# Patient Record
Sex: Male | Born: 1960 | Race: White | Hispanic: No | Marital: Married | Smoking: Current every day smoker
Health system: Southern US, Community
[De-identification: ages and names within clinical notes are randomized; demographics above are authoritative.]

---

## 2014-04-02 ENCOUNTER — Encounter (HOSPITAL_COMMUNITY): Payer: Self-pay | Admitting: *Deleted

## 2014-04-02 ENCOUNTER — Inpatient Hospital Stay (HOSPITAL_COMMUNITY)
Admission: EM | Admit: 2014-04-02 | Discharge: 2014-04-03 | DRG: 287 | Disposition: A | Discharge status: Home / Self Care | Payer: BC Managed Care – PPO | Attending: Internal Medicine | Admitting: Internal Medicine

## 2014-04-02 ENCOUNTER — Emergency Department (HOSPITAL_COMMUNITY): Payer: BC Managed Care – PPO

## 2014-04-02 DIAGNOSIS — E785 Hyperlipidemia, unspecified: Secondary | ICD-10-CM | POA: Diagnosis present

## 2014-04-02 DIAGNOSIS — I200 Unstable angina: Principal | ICD-10-CM | POA: Diagnosis present

## 2014-04-02 DIAGNOSIS — R079 Chest pain, unspecified: Secondary | ICD-10-CM | POA: Diagnosis present

## 2014-04-02 DIAGNOSIS — K219 Gastro-esophageal reflux disease without esophagitis: Secondary | ICD-10-CM | POA: Diagnosis present

## 2014-04-02 DIAGNOSIS — Z87891 Personal history of nicotine dependence: Secondary | ICD-10-CM | POA: Diagnosis not present

## 2014-04-02 DIAGNOSIS — Z8249 Family history of ischemic heart disease and other diseases of the circulatory system: Secondary | ICD-10-CM

## 2014-04-02 LAB — LIPID PANEL
CHOLESTEROL: 254 mg/dL — AB (ref 0–200)
HDL: 35 mg/dL — ABNORMAL LOW (ref >39)
LDL Cholesterol: 195 mg/dL — ABNORMAL HIGH (ref 0–99)
Total CHOL/HDL Ratio: 7.3 RATIO
Triglycerides: 122 mg/dL (ref <150)
VLDL: 24 mg/dL (ref 0–40)

## 2014-04-02 LAB — COMPREHENSIVE METABOLIC PANEL
ALBUMIN: 4.1 g/dL (ref 3.5–5.2)
ALT: 19 U/L (ref 0–53)
ANION GAP: 16 — AB (ref 5–15)
AST: 32 U/L (ref 0–37)
Alkaline Phosphatase: 57 U/L (ref 39–117)
BUN: 15 mg/dL (ref 6–23)
CO2: 21 mEq/L (ref 19–32)
CREATININE: 0.95 mg/dL (ref 0.50–1.35)
Calcium: 9.8 mg/dL (ref 8.4–10.5)
Chloride: 98 mEq/L (ref 96–112)
GFR calc non Af Amer: >90 mL/min (ref >90)
Glucose, Bld: 95 mg/dL (ref 70–99)
Potassium: 4.9 mEq/L (ref 3.7–5.3)
Sodium: 135 mEq/L — ABNORMAL LOW (ref 137–147)
TOTAL PROTEIN: 7.9 g/dL (ref 6.0–8.3)
Total Bilirubin: 1 mg/dL (ref 0.3–1.2)

## 2014-04-02 LAB — CBC WITH DIFFERENTIAL/PLATELET
BASOS PCT: 2 % — AB (ref 0–1)
Basophils Absolute: 0.1 10*3/uL (ref 0.0–0.1)
Eosinophils Absolute: 0.1 10*3/uL (ref 0.0–0.7)
Eosinophils Relative: 1 % (ref 0–5)
HEMATOCRIT: 41.9 % (ref 39.0–52.0)
HEMOGLOBIN: 14.9 g/dL (ref 13.0–17.0)
LYMPHS PCT: 34 % (ref 12–46)
Lymphs Abs: 2.3 10*3/uL (ref 0.7–4.0)
MCH: 31.3 pg (ref 26.0–34.0)
MCHC: 35.6 g/dL (ref 30.0–36.0)
MCV: 88 fL (ref 78.0–100.0)
MONO ABS: 0.5 10*3/uL (ref 0.1–1.0)
Monocytes Relative: 8 % (ref 3–12)
NEUTROS ABS: 3.6 10*3/uL (ref 1.7–7.7)
NEUTROS PCT: 55 % (ref 43–77)
Platelets: 193 10*3/uL (ref 150–400)
RBC: 4.76 MIL/uL (ref 4.22–5.81)
RDW: 13.4 % (ref 11.5–15.5)
WBC: 6.6 10*3/uL (ref 4.0–10.5)

## 2014-04-02 LAB — I-STAT TROPONIN, ED: TROPONIN I, POC: 0 ng/mL (ref 0.00–0.08)

## 2014-04-02 LAB — TROPONIN I

## 2014-04-02 LAB — D-DIMER, QUANTITATIVE: D-Dimer, Quant: 0.3 ug/mL-FEU (ref 0.00–0.48)

## 2014-04-02 MED ORDER — ENOXAPARIN SODIUM 40 MG/0.4ML ~~LOC~~ SOLN
40.0000 mg | SUBCUTANEOUS | Status: DC
  Administered 2014-04-03: 40 mg via SUBCUTANEOUS
  Filled 2014-04-02: qty 0.4

## 2014-04-02 MED ORDER — MORPHINE SULFATE 4 MG/ML IJ SOLN
4.0000 mg | Freq: Once | INTRAMUSCULAR | Status: AC
  Administered 2014-04-02: 4 mg via INTRAVENOUS
  Filled 2014-04-02: qty 1

## 2014-04-02 MED ORDER — HYDROMORPHONE HCL 1 MG/ML IJ SOLN
1.0000 mg | INTRAMUSCULAR | Status: DC | PRN
Start: 2014-04-02 — End: 2014-04-03
  Administered 2014-04-02: 1 mg via INTRAVENOUS
  Filled 2014-04-02: qty 1

## 2014-04-02 MED ORDER — FENTANYL CITRATE 0.05 MG/ML IJ SOLN
50.0000 ug | Freq: Once | INTRAMUSCULAR | Status: AC
  Administered 2014-04-02: 50 ug via INTRAVENOUS
  Filled 2014-04-02: qty 2

## 2014-04-02 MED ORDER — SODIUM CHLORIDE 0.9 % IJ SOLN
3.0000 mL | Freq: Two times a day (BID) | INTRAMUSCULAR | Status: DC
  Administered 2014-04-03 (×2): 3 mL via INTRAVENOUS

## 2014-04-02 MED ORDER — ACETAMINOPHEN 325 MG PO TABS: 650.0000 mg | ORAL_TABLET | Freq: Four times a day (QID) | ORAL | Status: DC | PRN

## 2014-04-02 MED ORDER — SODIUM CHLORIDE 0.9 % IJ SOLN: 3.0000 mL | INTRAMUSCULAR | Status: DC | PRN

## 2014-04-02 MED ORDER — MORPHINE SULFATE 2 MG/ML IJ SOLN: 2.0000 mg | INTRAMUSCULAR | Status: DC | PRN

## 2014-04-02 MED ORDER — SODIUM CHLORIDE 0.9 % IV SOLN: 250.0000 mL | INTRAVENOUS | Status: DC | PRN

## 2014-04-02 MED ORDER — ASPIRIN 81 MG PO CHEW
324.0000 mg | CHEWABLE_TABLET | Freq: Once | ORAL | Status: AC
  Administered 2014-04-02: 324 mg via ORAL
  Filled 2014-04-02: qty 4

## 2014-04-02 MED ORDER — ACETAMINOPHEN 650 MG RE SUPP: 650.0000 mg | Freq: Four times a day (QID) | RECTAL | Status: DC | PRN

## 2014-04-02 MED ORDER — ALUM & MAG HYDROXIDE-SIMETH 200-200-20 MG/5ML PO SUSP: 30.0000 mL | Freq: Four times a day (QID) | ORAL | Status: DC | PRN

## 2014-04-02 MED ORDER — NITROGLYCERIN 0.4 MG SL SUBL
0.4000 mg | SUBLINGUAL_TABLET | SUBLINGUAL | Status: AC | PRN
  Administered 2014-04-02 (×3): 0.4 mg via SUBLINGUAL
  Filled 2014-04-02: qty 1

## 2014-04-02 MED ORDER — HYDROMORPHONE HCL 1 MG/ML IJ SOLN
2.0000 mg | Freq: Once | INTRAMUSCULAR | Status: AC
Start: 2014-04-02 — End: 2014-04-02
  Administered 2014-04-02: 2 mg via INTRAVENOUS
  Filled 2014-04-02: qty 2

## 2014-04-02 MED ORDER — PANTOPRAZOLE SODIUM 40 MG IV SOLR
40.0000 mg | INTRAVENOUS | Status: DC
  Administered 2014-04-03: 40 mg via INTRAVENOUS
  Filled 2014-04-02 (×2): qty 40

## 2014-04-02 NOTE — ED Notes (Signed)
Report to [place], rn.  Pt care transferred

## 2014-04-02 NOTE — ED Notes (Signed)
Pt in c/o central chest pain with radiation down his right arm with numbness and tingling, states this started a few hours ago and has been constant, no distress noted

## 2014-04-02 NOTE — ED Notes (Signed)
Pt given heart healthy diet from dietary services, ate 90% of meal, now resting comfortably

## 2014-04-02 NOTE — H&P (Signed)
PCP:   No primary care provider on file.   Chief Complaint:  Chest pain  HPI: 53 year old male who   has no past medical history on file.  Today presents to the hospital with chief computer of chest pain which started this afternoon. Patient says that the pain started in the center of the chest and then also involve the right side and radiated to the right arm. The pain was sharp 9/10 in intensity and has come down to 6/10 intensity. Patient says that he ate  [place] food last night, and this morning he felt uneasy in the stomach so he took 2 Rolaids. He says that his symptoms improved little bit, but he continued to have the chest pain. He does not have history of CAD, he is a former smoker quit smoking 3 years ago, no history of diabetes mellitus or hypertension. Patient's father had cancer and is not sure whether he died of MI. In the ED patient was found to have normal EKG, normal chest x-ray, cardiac enzymes are negative 1.  Allergies:  No Known Allergies   History reviewed. No pertinent past medical history.  History reviewed. No pertinent past surgical history.  Prior to Admission medications   Not on File    Social History:  reports that he has been smoking.  He does not have any smokeless tobacco history on file. His alcohol and drug histories are not on file.  History reviewed. No pertinent family history.   All the positives are listed in BOLD  Review of Systems:  HEENT: Headache, blurred vision, runny nose, sore throat Neck: Hypothyroidism, hyperthyroidism,,lymphadenopathy Chest : Shortness of breath, history of COPD, Asthma Heart : Chest pain, history of coronary arterey disease GI:  Nausea, vomiting, diarrhea, constipation, GERD GU: Dysuria, urgency, frequency of urination, hematuria Neuro: Stroke, seizures, syncope Psych: Depression, anxiety, hallucinations   Physical Exam: Blood pressure 107/70, pulse 73, temperature 97.5 F (36.4 C), temperature source  Oral, resp. rate 19, height 5\' 4"  (1.626 m), weight 90.719 kg (200 lb), SpO2 96 %. Constitutional:   Patient is a well-developed and well-nourished male in no acute distress and cooperative with exam. Head: Normocephalic and atraumatic Mouth: Mucus membranes moist Eyes: PERRL, EOMI, conjunctivae normal Neck: Supple, No Thyromegaly Cardiovascular: RRR, S1 normal, S2 normal Pulmonary/Chest: CTAB, no wheezes, rales, or rhonchi. Positive tenderness to palpation of chest wall and the sternum Abdominal: Soft. Non-tender, non-distended, bowel sounds are normal, no masses, organomegaly, or guarding present.  Neurological: A&O x3, Strenght is normal and symmetric bilaterally, cranial nerve II-XII are grossly intact, no focal motor deficit, sensory intact to light touch bilaterally.  Extremities : No Cyanosis, Clubbing or Edema  Labs on Admission:  Basic Metabolic Panel:  Recent Labs Lab 04/02/14 1250  NA 135*  K 4.9  CL 98  CO2 21  GLUCOSE 95  BUN 15  CREATININE 0.95  CALCIUM 9.8   Liver Function Tests:  Recent Labs Lab 04/02/14 1250  AST 32  ALT 19  ALKPHOS 57  BILITOT 1.0  PROT 7.9  ALBUMIN 4.1   No results for input(s): LIPASE, AMYLASE in the last 168 hours. No results for input(s): AMMONIA in the last 168 hours. CBC:  Recent Labs Lab 04/02/14 1341  WBC 6.6  NEUTROABS 3.6  HGB 14.9  HCT 41.9  MCV 88.0  PLT 193   Cardiac Enzymes: No results for input(s): CKTOTAL, CKMB, CKMBINDEX, TROPONINI in the last 168 hours.  BNP (last 3 results) No results for input(s): PROBNP  in the last 8760 hours. CBG: No results for input(s): GLUCAP in the last 168 hours.  Radiological Exams on Admission: Dg Chest 2 View  04/02/2014   CLINICAL DATA:  Acute onset mid and right-sided chest pain radiating into the right arm earlier today. Former smoker who quit approximately 3 years ago.  EXAM: CHEST  2 VIEW  COMPARISON:  None.  FINDINGS: Cardiac silhouette normal in size. Thoracic aorta  mildly tortuous and atherosclerotic. Hilar and mediastinal contours otherwise unremarkable. Lungs clear. Bronchovascular markings normal. Pulmonary vascularity normal. No visible pleural effusions. No pneumothorax. Degenerative changes involving the thoracic spine.  IMPRESSION: No acute cardiopulmonary disease.   Electronically Signed   By: Hulan Saashomas  Lawrence M.D.   On: 04/02/2014 13:45    EKG: Independently reviewed. Normal sinus rhythm   Assessment/Plan Principal Problem:   Chest pain  Chest pain Appears atypical, will admit the patient and cycle the cardiac enzymes. Will give morphine when necessary for pain. We'll also start IV Protonix 40 mg daily along with Maalox 30 mL every 6 hours when necessary for dyspepsia. I will also obtain lipid panel in a.m.  Dyspnea on exertion Patient says that he has been having dyspnea on exertion for a long time, will check echocardiogram to assess the ejection fraction and any other wall motion abnormality.  DVT prophylaxis Lovenox  Code status:Patient is full code  Family discussion: Admission, patients condition and plan of care including tests being ordered have been discussed with the patient and *his wife at bedside* who indicate understanding and agree with the plan and Code Status.   Time Spent on Admission: 60 minutes  Aydn Ferrara S [facility] Pager: [phone] 04/02/2014, 4:30 PM  If 7PM-7AM, please contact night-coverage  [email or web address]  Password [email or web address]

## 2014-04-02 NOTE — ED Provider Notes (Signed)
CSN: [id number]     Arrival date & time 04/02/14  1223 History   First MD Initiated Contact with Patient 04/02/14 1231     Chief Complaint  Patient presents with  . Chest Pain    Patient is a 53 y.o. male presenting with chest pain. The history is provided by the patient.  Chest Pain Pain location:  R chest and substernal area Pain quality: sharp   Pain radiates to:  R arm Pain radiates to the back: no   Pain severity:  Severe Onset quality:  Sudden Duration:  2 hours Timing:  Constant Progression:  Unchanged Chronicity:  New Context: at rest   Relieved by:  None tried Worsened by:  Nothing tried Ineffective treatments:  None tried Associated symptoms: diaphoresis and shortness of breath   Associated symptoms: no abdominal pain, no cough, no fever, no nausea and not vomiting   Shortness of breath:    Severity:  Moderate   Onset quality:  Sudden   Duration:  2 hours   Timing:  Constant   Progression:  Waxing and waning Risk factors: no coronary artery disease, no diabetes mellitus, no high cholesterol and no hypertension     History reviewed. No pertinent past medical history.   History reviewed. No pertinent past surgical history.   History reviewed. No pertinent family history.   History  Substance Use Topics  . Smoking status: Current Every Day Smoker  . Smokeless tobacco: Not on file  . Alcohol Use: Not on file    Review of Systems  Constitutional: Positive for diaphoresis. Negative for fever.  Respiratory: Positive for shortness of breath. Negative for cough.   Cardiovascular: Positive for chest pain.  Gastrointestinal: Negative for nausea, vomiting and abdominal pain.  All other systems reviewed and are negative.   Allergies  Review of patient's allergies indicates no known allergies.  Home Medications   Prior to Admission medications   Not on File   BP 127/76 mmHg  Pulse 87  Temp(Src) 97.5 F (36.4 C) (Oral)  Resp 20  Ht 5\' 4"  (1.626 m)  Wt  200 lb (90.719 kg)  BMI 34.31 kg/m2  SpO2 99%   Physical Exam  Constitutional: He is oriented to person, place, and time. He appears well-developed and well-nourished. No distress.  HENT:  Head: Normocephalic and atraumatic.  Right Ear: External ear normal.  Left Ear: External ear normal.  Mouth/Throat: Oropharynx is clear and moist.  Eyes: EOM are normal. Pupils are equal, round, and reactive to light.  Neck: Normal range of motion.  Cardiovascular: Normal rate and regular rhythm.   Pulses:      Radial pulses are 2+ on the right side, and 2+ on the left side.       Dorsalis pedis pulses are 2+ on the right side, and 2+ on the left side.  Pulmonary/Chest: Effort normal and breath sounds normal. No respiratory distress. He has no wheezes. He has no rales.  Abdominal: Soft. He exhibits no distension. There is no tenderness. There is no rebound and no guarding.  Neurological: He is alert and oriented to person, place, and time.  Skin: Skin is warm and dry. No rash noted. He is not diaphoretic.  Psychiatric: He has a normal mood and affect.  Vitals reviewed.   ED Course  Procedures  Labs Review  Results for orders placed or performed during the hospital encounter of 04/02/14  Comprehensive metabolic panel  Result Value Ref Range   Sodium 135 (L) 137 -  147 mEq/L   Potassium 4.9 3.7 - 5.3 mEq/L   Chloride 98 96 - 112 mEq/L   CO2 21 19 - 32 mEq/L   Glucose, Bld 95 70 - 99 mg/dL   BUN 15 6 - 23 mg/dL   Creatinine, Ser 1.190.95 0.50 - 1.35 mg/dL   Calcium 9.8 8.4 - 14.710.5 mg/dL   Total Protein 7.9 6.0 - 8.3 g/dL   Albumin 4.1 3.5 - 5.2 g/dL   AST 32 0 - 37 U/L   ALT 19 0 - 53 U/L   Alkaline Phosphatase 57 39 - 117 U/L   Total Bilirubin 1.0 0.3 - 1.2 mg/dL   GFR calc non Af Amer >90 >90 mL/min   GFR calc Af Amer >90 >90 mL/min   Anion gap 16 (H) 5 - 15  CBC with Differential  Result Value Ref Range   WBC 6.6 4.0 - 10.5 K/uL   RBC 4.76 4.22 - 5.81 MIL/uL   Hemoglobin 14.9 13.0 -  17.0 g/dL   HCT 82.941.9 56.239.0 - 13.052.0 %   MCV 88.0 78.0 - 100.0 fL   MCH 31.3 26.0 - 34.0 pg   MCHC 35.6 30.0 - 36.0 g/dL   RDW 86.513.4 78.411.5 - 69.615.5 %   Platelets 193 150 - 400 K/uL   Neutrophils Relative % 55 43 - 77 %   Neutro Abs 3.6 1.7 - 7.7 K/uL   Lymphocytes Relative 34 12 - 46 %   Lymphs Abs 2.3 0.7 - 4.0 K/uL   Monocytes Relative 8 3 - 12 %   Monocytes Absolute 0.5 0.1 - 1.0 K/uL   Eosinophils Relative 1 0 - 5 %   Eosinophils Absolute 0.1 0.0 - 0.7 K/uL   Basophils Relative 2 (H) 0 - 1 %   Basophils Absolute 0.1 0.0 - 0.1 K/uL  D-dimer, quantitative  Result Value Ref Range   D-Dimer, Quant 0.30 0.00 - 0.48 ug/mL-FEU  I-stat troponin, ED [facility])  Result Value Ref Range   Troponin i, poc 0.00 0.00 - 0.08 ng/mL   Comment 3             Imaging Review Dg Chest 2 View  04/02/2014   CLINICAL DATA:  Acute onset mid and right-sided chest pain radiating into the right arm earlier today. Former smoker who quit approximately 3 years ago.  EXAM: CHEST  2 VIEW  COMPARISON:  None.  FINDINGS: Cardiac silhouette normal in size. Thoracic aorta mildly tortuous and atherosclerotic. Hilar and mediastinal contours otherwise unremarkable. Lungs clear. Bronchovascular markings normal. Pulmonary vascularity normal. No visible pleural effusions. No pneumothorax. Degenerative changes involving the thoracic spine.  IMPRESSION: No acute cardiopulmonary disease.   Electronically Signed   By: Hulan Saashomas  Lawrence M.D.   On: 04/02/2014 13:45     EKG Interpretation   Date/Time:  Thursday April 02 2014 12:29:05 EST Ventricular Rate:  87 PR Interval:  148 QRS Duration: 84 QT Interval:  360 QTC Calculation: 433 R Axis:   89 Text Interpretation:  Normal sinus rhythm Normal ECG No old tracing to  compare Confirmed by Ethelda ChickJACUBOWITZ  MD, SAM [id number]) on 04/02/2014 12:55:12 PM      MDM   Final diagnoses:  Chest pain   53 y.o. otherwise healthy male (but states that he does not routinely go to a doctor)  presents for sudden onset right sided chest pain.   Concern for PE, ACS, pneumothorax. Less concerned for an infectious process such as pneumonia given sudden onset of symptoms.  CXR showed no acute process. Dimer negative - no concern for PE.   He was given aspirin. No change in pain with nitro or morphine. Ordered fentanyl for pain control.   He is otherwise healthy, however does not regularly see a physician. Quit smoking 2 years ago. He is unclear on family history. Need to rule out ACS.   Given his severe, sudden onset persistent pain, medicine was consulted for admission. He was admitted to the hospitalist.   This case was managed in conjunction with my attending, Dr. Ethelda ChickJacubowitz.    Maxine GlennAnn Rocio Wolak, MD 04/02/14 1644  Doug SouSam Jacubowitz, MD 04/02/14 [id number]

## 2014-04-02 NOTE — ED Notes (Signed)
Pt given dinner  

## 2014-04-02 NOTE — ED Notes (Signed)
Spoke with dr Sharl Malama regarding patient having continuous chest pain.  Verbal orders received

## 2014-04-02 NOTE — ED Provider Notes (Signed)
Complains of anterior chest pain rating to right arm onset 10:30 AM today gradually. Sinuses symptoms include mild shortness of breath. Symptoms somewhat improved with deep inspiration. Not exertional. Admits to sweatiness denies nausea. Feels improved since treatment in the emergency department. Card risk factors former smoker otherwise unknown to patient. Family history is unknown to him he has not seen a personal physician in several years. On exam alert Glasgow Coma Score 15 lungs clear auscultation heart regular rate and rhythm abdomen nondistended nontender extremities without edema  Doug SouSam Samayra Hebel, MD 04/02/14 1720

## 2014-04-03 ENCOUNTER — Encounter (HOSPITAL_COMMUNITY)
Admission: EM | Disposition: A | Discharge status: Home / Self Care | Payer: Self-pay | Source: Home / Self Care | Attending: Family Medicine

## 2014-04-03 DIAGNOSIS — I200 Unstable angina: Principal | ICD-10-CM

## 2014-04-03 DIAGNOSIS — E785 Hyperlipidemia, unspecified: Secondary | ICD-10-CM

## 2014-04-03 DIAGNOSIS — I25110 Atherosclerotic heart disease of native coronary artery with unstable angina pectoris: Secondary | ICD-10-CM

## 2014-04-03 DIAGNOSIS — I209 Angina pectoris, unspecified: Secondary | ICD-10-CM

## 2014-04-03 DIAGNOSIS — R072 Precordial pain: Secondary | ICD-10-CM

## 2014-04-03 HISTORY — PX: LEFT HEART CATHETERIZATION WITH CORONARY ANGIOGRAM: SHX5451

## 2014-04-03 LAB — COMPREHENSIVE METABOLIC PANEL
ALBUMIN: 3.6 g/dL (ref 3.5–5.2)
ALK PHOS: 56 U/L (ref 39–117)
ALT: 14 U/L (ref 0–53)
AST: 19 U/L (ref 0–37)
Anion gap: 12 (ref 5–15)
BUN: 17 mg/dL (ref 6–23)
CALCIUM: 9.4 mg/dL (ref 8.4–10.5)
CO2: 26 mEq/L (ref 19–32)
Chloride: 100 mEq/L (ref 96–112)
Creatinine, Ser: 1.25 mg/dL (ref 0.50–1.35)
GFR calc Af Amer: 74 mL/min — ABNORMAL LOW (ref >90)
GFR calc non Af Amer: 64 mL/min — ABNORMAL LOW (ref >90)
GLUCOSE: 95 mg/dL (ref 70–99)
POTASSIUM: 4.3 meq/L (ref 3.7–5.3)
SODIUM: 138 meq/L (ref 137–147)
Total Bilirubin: 1.1 mg/dL (ref 0.3–1.2)
Total Protein: 7.3 g/dL (ref 6.0–8.3)

## 2014-04-03 LAB — CBC
HCT: 40.7 % (ref 39.0–52.0)
HEMOGLOBIN: 14.1 g/dL (ref 13.0–17.0)
MCH: 30.8 pg (ref 26.0–34.0)
MCHC: 34.6 g/dL (ref 30.0–36.0)
MCV: 88.9 fL (ref 78.0–100.0)
Platelets: 208 10*3/uL (ref 150–400)
RBC: 4.58 MIL/uL (ref 4.22–5.81)
RDW: 13.6 % (ref 11.5–15.5)
WBC: 7.4 10*3/uL (ref 4.0–10.5)

## 2014-04-03 LAB — TROPONIN I: Troponin I: <0.3 ng/mL (ref <0.30)

## 2014-04-03 SURGERY — LEFT HEART CATHETERIZATION WITH CORONARY ANGIOGRAM
Anesthesia: LOCAL

## 2014-04-03 MED ORDER — HEPARIN SODIUM (PORCINE) 1000 UNIT/ML IJ SOLN
INTRAMUSCULAR | Status: AC
  Filled 2014-04-03: qty 1

## 2014-04-03 MED ORDER — FENTANYL CITRATE 0.05 MG/ML IJ SOLN
INTRAMUSCULAR | Status: AC
  Filled 2014-04-03: qty 2

## 2014-04-03 MED ORDER — SODIUM CHLORIDE 0.9 % IV SOLN
1.0000 mL/kg/h | INTRAVENOUS | Status: AC
  Administered 2014-04-03: 1 mL/kg/h via INTRAVENOUS

## 2014-04-03 MED ORDER — SODIUM CHLORIDE 0.9 % IJ SOLN: 3.0000 mL | Freq: Two times a day (BID) | INTRAMUSCULAR | Status: DC

## 2014-04-03 MED ORDER — ASPIRIN 81 MG PO CHEW
CHEWABLE_TABLET | ORAL | Status: AC
  Administered 2014-04-03: 15:00:00
  Filled 2014-04-03: qty 1

## 2014-04-03 MED ORDER — VERAPAMIL HCL 2.5 MG/ML IV SOLN
INTRAVENOUS | Status: AC
  Filled 2014-04-03: qty 2

## 2014-04-03 MED ORDER — SIMVASTATIN 20 MG PO TABS: 20.0000 mg | ORAL_TABLET | Freq: Every day | ORAL | Status: AC

## 2014-04-03 MED ORDER — PANTOPRAZOLE SODIUM 40 MG PO TBEC: 40.0000 mg | DELAYED_RELEASE_TABLET | Freq: Every day | ORAL | Status: AC

## 2014-04-03 MED ORDER — SODIUM CHLORIDE 0.9 % IV SOLN: 250.0000 mL | INTRAVENOUS | Status: DC | PRN

## 2014-04-03 MED ORDER — LIDOCAINE HCL (PF) 1 % IJ SOLN
INTRAMUSCULAR | Status: AC
  Filled 2014-04-03: qty 30

## 2014-04-03 MED ORDER — ATORVASTATIN CALCIUM 80 MG PO TABS
80.0000 mg | ORAL_TABLET | Freq: Every day | ORAL | Status: DC
Start: 2014-04-04 — End: 2014-04-03
  Filled 2014-04-03: qty 1

## 2014-04-03 MED ORDER — ATORVASTATIN CALCIUM 80 MG PO TABS
80.0000 mg | ORAL_TABLET | ORAL | Status: DC
Start: 2014-04-03 — End: 2014-04-03
  Filled 2014-04-03: qty 1

## 2014-04-03 MED ORDER — SODIUM CHLORIDE 0.9 % IV SOLN
INTRAVENOUS | Status: DC
  Administered 2014-04-03: 15:00:00 via INTRAVENOUS

## 2014-04-03 MED ORDER — NITROGLYCERIN 1 MG/10 ML FOR IR/CATH LAB
INTRA_ARTERIAL | Status: AC
  Filled 2014-04-03: qty 10

## 2014-04-03 MED ORDER — HEPARIN (PORCINE) IN NACL 2-0.9 UNIT/ML-% IJ SOLN
INTRAMUSCULAR | Status: AC
  Filled 2014-04-03: qty 1500

## 2014-04-03 MED ORDER — MIDAZOLAM HCL 2 MG/2ML IJ SOLN
INTRAMUSCULAR | Status: AC
  Filled 2014-04-03: qty 2

## 2014-04-03 MED ORDER — SODIUM CHLORIDE 0.9 % IJ SOLN: 3.0000 mL | INTRAMUSCULAR | Status: DC | PRN

## 2014-04-03 MED ORDER — ASPIRIN 81 MG PO CHEW
81.0000 mg | CHEWABLE_TABLET | ORAL | Status: AC
  Administered 2014-04-03: 81 mg via ORAL

## 2014-04-03 NOTE — H&P (View-Only) (Signed)
CONSULT NOTE  Date: 04/03/2014               Patient Name:  Aaron Strickland MRN: [id number]  DOB: 1960-12-11 Age / Sex: 53 y.o., male        PCP: No primary care provider on file. Primary Cardiologist: Luvenia Heller            Referring Physician: [facility]              Reason for Consult: Chest pain            History of Present Illness: Patient is a 53 y.o. male with  Hx of hyperlipidemia , who was admitted to [facility] on 04/02/2014 for evaluation of  Chest discomfort. .   Location:  Center of chest, across right side to right arm. 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   Exercises every morning at work , mostly stretching , no cardio exercise. Does have some DOE doing yard work   Does not go to the doctor regularly Hx of elevated lipids, took meds for a while and then quit.  Former smoker, quit 3 years ago No ETOH Fhx : father died with MI in his 38s.   Medications: Outpatient medications: No prescriptions prior to admission    Current medications: Current Facility-Administered Medications  Medication Dose Route Frequency Provider Last Rate Last Dose  . 0.9 %  sodium chloride infusion  250 mL Intravenous PRN Meredeth Ide, MD      . acetaminophen (TYLENOL) tablet 650 mg  650 mg Oral Q6H PRN Meredeth Ide, MD       Or  . acetaminophen (TYLENOL) suppository 650 mg  650 mg Rectal Q6H PRN Meredeth Ide, MD      . alum & mag hydroxide-simeth (MAALOX/MYLANTA) 200-200-20 MG/5ML suspension 30 mL  30 mL Oral Q6H PRN Meredeth Ide, MD      . enoxaparin (LOVENOX) injection 40 mg  40 mg Subcutaneous Q24H Meredeth Ide, MD   40 mg at 04/03/14 1016  . HYDROmorphone (DILAUDID) injection 1 mg  1 mg Intravenous Q4H PRN  Leanne Chang, NP   1 mg at 04/02/14 2245  . morphine 2 MG/ML injection 2 mg  2 mg Intravenous Q4H PRN Meredeth Ide, MD      . pantoprazole (PROTONIX) injection 40 mg  40 mg Intravenous Q24H Meredeth Ide, MD   40 mg at 04/03/14 0029  . sodium chloride 0.9 % injection 3 mL  3 mL Intravenous Q12H Meredeth Ide, MD   3 mL at 04/03/14 1000  . sodium chloride 0.9 % injection 3 mL  3 mL Intravenous PRN Meredeth Ide, MD         No Known Allergies   History reviewed. No pertinent past medical history.  History reviewed. No pertinent past surgical history.  History reviewed. No pertinent family history.  Social History:  reports that he has been smoking.  He does not have any smokeless tobacco history on file. His alcohol and drug histories are not on file.   Review of Systems: Constitutional:  denies fever, chills, diaphoresis, appetite change and fatigue.  HEENT: denies photophobia, eye pain, redness, hearing loss,  ear pain, congestion, sore throat, rhinorrhea, sneezing, neck pain, neck stiffness and tinnitus.  Respiratory: admits to SOB, DOE,  Cardiovascular: admits to chest pain,   Gastrointestinal: denies nausea, vomiting, abdominal pain, diarrhea, constipation, blood in stool.  Genitourinary: denies dysuria, urgency, frequency, hematuria, flank pain and difficulty urinating.  Musculoskeletal: denies  myalgias, back pain, joint swelling, arthralgias and gait problem.   Skin: denies pallor, rash and wound.  Neurological: denies dizziness, seizures, syncope, weakness, light-headedness, numbness and headaches.   Hematological: denies adenopathy, easy bruising, personal or family bleeding history.  Psychiatric/ Behavioral: denies suicidal ideation, mood changes, confusion, nervousness, sleep disturbance and agitation.    Physical Exam: BP 127/66 mmHg  Pulse 64  Temp(Src) 98 F (36.7 C) (Oral)  Resp 17  Ht 5\' 4"  (1.626 m)  Wt 194 lb 8 oz (88.225 kg)  BMI 33.37 kg/m2  SpO2 97%    Wt Readings from Last 3 Encounters:  04/03/14 194 lb 8 oz (88.225 kg)    General: Vital signs reviewed and noted. Well-developed, well-nourished, in no acute distress; alert,   Head: Normocephalic, atraumatic, sclera anicteric,   Neck: Supple. Negative for carotid bruits. No JVD   Lungs:  Clear bilaterally, no  wheezes, rales, or rhonchi. Breathing is normal   Heart: RRR with S1 S2. No murmurs, rubs, or gallops   Abdomen:  Soft, non-tender, non-distended with normoactive bowel sounds. No hepatomegaly. No rebound/guarding. No obvious abdominal masses   MSK: Strength and the appear normal for age.   Extremities: No clubbing or cyanosis. No edema.  Distal pedal pulses are 2+ and equal   Neurologic: Alert and oriented X 3. Moves all extremities spontaneously.  Psych: Responds to questions appropriately with a normal affect.     Lab results: Basic Metabolic Panel:  Recent Labs Lab 04/02/14 1250 04/03/14 0118  NA 135* 138  K 4.9 4.3  CL 98 100  CO2 21 26  GLUCOSE 95 95  BUN 15 17  CREATININE 0.95 1.25  CALCIUM 9.8 9.4    Liver Function Tests:  Recent Labs Lab 04/02/14 1250 04/03/14 0118  AST 32 19  ALT 19 14  ALKPHOS 57 56  BILITOT 1.0 1.1  PROT 7.9 7.3  ALBUMIN 4.1 3.6   No results for input(s): LIPASE, AMYLASE in the last 168 hours. No results for input(s): AMMONIA in the last 168 hours.  CBC:  Recent Labs Lab 04/02/14 1341 04/03/14 0118  WBC 6.6 7.4  NEUTROABS 3.6  --   HGB 14.9 14.1  HCT 41.9 40.7  MCV 88.0 88.9  PLT 193 208    Cardiac Enzymes:  Recent Labs Lab 04/02/14 1926 04/03/14 0118 04/03/14 0724  TROPONINI <0.30 <0.30 <0.30    BNP: Invalid input(s): POCBNP  CBG: No results for input(s): GLUCAP in the last 168 hours.  Coagulation Studies: No results for input(s): LABPROT, INR in the last 72 hours.   Other results: EKG :  NSR , No St or T wave changes  Imaging: Dg Chest 2 View  04/02/2014   CLINICAL DATA:  Acute onset mid  and right-sided chest pain radiating into the right arm earlier today. Former smoker who quit approximately 3 years ago.  EXAM: CHEST  2 VIEW  COMPARISON:  None.  FINDINGS: Cardiac silhouette normal in size. Thoracic aorta mildly tortuous and atherosclerotic. Hilar and mediastinal contours otherwise unremarkable. Lungs clear. Bronchovascular markings normal. Pulmonary vascularity normal. No visible pleural effusions. No pneumothorax. Degenerative changes involving the thoracic spine.  IMPRESSION: No acute cardiopulmonary disease.  Electronically Signed   By: Hulan Saashomas  Lawrence M.D.   On: 04/02/2014 13:45       Assessment & Plan:  1. Chest pain:  The patient has had CP since yesterday.  Still has some pressure but the severe , sharp pain has resolved.   Troponin levels are negative. eCG is unremarkable. Echo shows akinesis of the Inf. Base.   Has + hyperlipidemia and family hx  I think our best option is to proceed with cath. He ate 2 bites of lunch but otherwise is NPO since breakfast.   I have discussed risks, benefits, options.  He and his family understand and he agrees to proceed.   2. Hyperlipidemia:  He will need to start and continue a statin. Needs to exercise.   3.  ? Of OSA:  Plans per medical team.     Aaron Strickland, Jr., MD, [facility] 04/03/2014, 12:05 PM Office - [phone] Pager [phone]- [phone]

## 2014-04-03 NOTE — Plan of Care (Signed)
Problem: Consults Goal: Cardiac Cath Patient Education (See Patient Education module for education specifics.) Outcome: Completed/Met Date Met:  04/03/14 Goal: Skin Care Protocol Initiated - if Braden Score 18 or less If consults are not indicated, leave blank or document N/A Outcome: Not Applicable Date Met:  02/33/43 Goal: Diabetes Guidelines if Diabetic/Glucose > 140 If diabetic or lab glucose is > 140 mg/dl - Initiate Diabetes/Hyperglycemia Guidelines & Document Interventions  Outcome: Not Applicable Date Met:  56/86/16

## 2014-04-03 NOTE — Plan of Care (Signed)
Problem: Phase I Progression Outcomes Goal: MD aware of Cardiac Marker results Outcome: Completed/Met Date Met:  04/03/14

## 2014-04-03 NOTE — Progress Notes (Signed)
Comments:  Notified at approx 2100 that pt has persistent CP despite pain medication and is unable to go to telemetry floor d/t persistent CP. Level of care changed to SDU. ED RN now calls reporting that pt has had negative Troponin's x 2. He continues to c/o CP. In view of normal EKG and negative troponin's x 2 will change pt's LOC to telemetry as is unlikely pt's CP has cardiac source. Discussed pt w/ Dr Allena KatzPatel who is in agreement w/ plan.  Leanne ChangKatherine P. Erikson Danzy, NP-C [facility] Pager [phone]

## 2014-04-03 NOTE — Consult Note (Signed)
CONSULT NOTE  Date: 04/03/2014               Patient Name:  Aaron Strickland MRN: [id number]  DOB: 1960-12-11 Age / Sex: 53 y.o., male        PCP: No primary care provider on file. Primary Cardiologist: Luvenia Heller            Referring Physician: [facility]              Reason for Consult: Chest pain            History of Present Illness: Patient is a 53 y.o. male with  Hx of hyperlipidemia , who was admitted to [facility] on 04/02/2014 for evaluation of  Chest discomfort. .   Location:  Center of chest, across right side to right arm. 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   Exercises every morning at work , mostly stretching , no cardio exercise. Does have some DOE doing yard work   Does not go to the doctor regularly Hx of elevated lipids, took meds for a while and then quit.  Former smoker, quit 3 years ago No ETOH Fhx : father died with MI in his 38s.   Medications: Outpatient medications: No prescriptions prior to admission    Current medications: Current Facility-Administered Medications  Medication Dose Route Frequency Provider Last Rate Last Dose  . 0.9 %  sodium chloride infusion  250 mL Intravenous PRN Meredeth Ide, MD      . acetaminophen (TYLENOL) tablet 650 mg  650 mg Oral Q6H PRN Meredeth Ide, MD       Or  . acetaminophen (TYLENOL) suppository 650 mg  650 mg Rectal Q6H PRN Meredeth Ide, MD      . alum & mag hydroxide-simeth (MAALOX/MYLANTA) 200-200-20 MG/5ML suspension 30 mL  30 mL Oral Q6H PRN Meredeth Ide, MD      . enoxaparin (LOVENOX) injection 40 mg  40 mg Subcutaneous Q24H Meredeth Ide, MD   40 mg at 04/03/14 1016  . HYDROmorphone (DILAUDID) injection 1 mg  1 mg Intravenous Q4H PRN  Leanne Chang, NP   1 mg at 04/02/14 2245  . morphine 2 MG/ML injection 2 mg  2 mg Intravenous Q4H PRN Meredeth Ide, MD      . pantoprazole (PROTONIX) injection 40 mg  40 mg Intravenous Q24H Meredeth Ide, MD   40 mg at 04/03/14 0029  . sodium chloride 0.9 % injection 3 mL  3 mL Intravenous Q12H Meredeth Ide, MD   3 mL at 04/03/14 1000  . sodium chloride 0.9 % injection 3 mL  3 mL Intravenous PRN Meredeth Ide, MD         No Known Allergies   History reviewed. No pertinent past medical history.  History reviewed. No pertinent past surgical history.  History reviewed. No pertinent family history.  Social History:  reports that he has been smoking.  He does not have any smokeless tobacco history on file. His alcohol and drug histories are not on file.   Review of Systems: Constitutional:  denies fever, chills, diaphoresis, appetite change and fatigue.  HEENT: denies photophobia, eye pain, redness, hearing loss,  ear pain, congestion, sore throat, rhinorrhea, sneezing, neck pain, neck stiffness and tinnitus.  Respiratory: admits to SOB, DOE,  Cardiovascular: admits to chest pain,   Gastrointestinal: denies nausea, vomiting, abdominal pain, diarrhea, constipation, blood in stool.  Genitourinary: denies dysuria, urgency, frequency, hematuria, flank pain and difficulty urinating.  Musculoskeletal: denies  myalgias, back pain, joint swelling, arthralgias and gait problem.   Skin: denies pallor, rash and wound.  Neurological: denies dizziness, seizures, syncope, weakness, light-headedness, numbness and headaches.   Hematological: denies adenopathy, easy bruising, personal or family bleeding history.  Psychiatric/ Behavioral: denies suicidal ideation, mood changes, confusion, nervousness, sleep disturbance and agitation.    Physical Exam: BP 127/66 mmHg  Pulse 64  Temp(Src) 98 F (36.7 C) (Oral)  Resp 17  Ht 5\' 4"  (1.626 m)  Wt 194 lb 8 oz (88.225 kg)  BMI 33.37 kg/m2  SpO2 97%    Wt Readings from Last 3 Encounters:  04/03/14 194 lb 8 oz (88.225 kg)    General: Vital signs reviewed and noted. Well-developed, well-nourished, in no acute distress; alert,   Head: Normocephalic, atraumatic, sclera anicteric,   Neck: Supple. Negative for carotid bruits. No JVD   Lungs:  Clear bilaterally, no  wheezes, rales, or rhonchi. Breathing is normal   Heart: RRR with S1 S2. No murmurs, rubs, or gallops   Abdomen:  Soft, non-tender, non-distended with normoactive bowel sounds. No hepatomegaly. No rebound/guarding. No obvious abdominal masses   MSK: Strength and the appear normal for age.   Extremities: No clubbing or cyanosis. No edema.  Distal pedal pulses are 2+ and equal   Neurologic: Alert and oriented X 3. Moves all extremities spontaneously.  Psych: Responds to questions appropriately with a normal affect.     Lab results: Basic Metabolic Panel:  Recent Labs Lab 04/02/14 1250 04/03/14 0118  NA 135* 138  K 4.9 4.3  CL 98 100  CO2 21 26  GLUCOSE 95 95  BUN 15 17  CREATININE 0.95 1.25  CALCIUM 9.8 9.4    Liver Function Tests:  Recent Labs Lab 04/02/14 1250 04/03/14 0118  AST 32 19  ALT 19 14  ALKPHOS 57 56  BILITOT 1.0 1.1  PROT 7.9 7.3  ALBUMIN 4.1 3.6   No results for input(s): LIPASE, AMYLASE in the last 168 hours. No results for input(s): AMMONIA in the last 168 hours.  CBC:  Recent Labs Lab 04/02/14 1341 04/03/14 0118  WBC 6.6 7.4  NEUTROABS 3.6  --   HGB 14.9 14.1  HCT 41.9 40.7  MCV 88.0 88.9  PLT 193 208    Cardiac Enzymes:  Recent Labs Lab 04/02/14 1926 04/03/14 0118 04/03/14 0724  TROPONINI <0.30 <0.30 <0.30    BNP: Invalid input(s): POCBNP  CBG: No results for input(s): GLUCAP in the last 168 hours.  Coagulation Studies: No results for input(s): LABPROT, INR in the last 72 hours.   Other results: EKG :  NSR , No St or T wave changes  Imaging: Dg Chest 2 View  04/02/2014   CLINICAL DATA:  Acute onset mid  and right-sided chest pain radiating into the right arm earlier today. Former smoker who quit approximately 3 years ago.  EXAM: CHEST  2 VIEW  COMPARISON:  None.  FINDINGS: Cardiac silhouette normal in size. Thoracic aorta mildly tortuous and atherosclerotic. Hilar and mediastinal contours otherwise unremarkable. Lungs clear. Bronchovascular markings normal. Pulmonary vascularity normal. No visible pleural effusions. No pneumothorax. Degenerative changes involving the thoracic spine.  IMPRESSION: No acute cardiopulmonary disease.  Electronically Signed   By: Hulan Saashomas  Lawrence M.D.   On: 04/02/2014 13:45       Assessment & Plan:  1. Chest pain:  The patient has had CP since yesterday.  Still has some pressure but the severe , sharp pain has resolved.   Troponin levels are negative. eCG is unremarkable. Echo shows akinesis of the Inf. Base.   Has + hyperlipidemia and family hx  I think our best option is to proceed with cath. He ate 2 bites of lunch but otherwise is NPO since breakfast.   I have discussed risks, benefits, options.  He and his family understand and he agrees to proceed.   2. Hyperlipidemia:  He will need to start and continue a statin. Needs to exercise.   3.  ? Of OSA:  Plans per medical team.     Alvia GrovePhilip J. Jezel Basto, Jr., MD, [facility] 04/03/2014, 12:05 PM Office - [phone] Pager [phone]- [phone]

## 2014-04-03 NOTE — Plan of Care (Signed)
Problem: Phase I Progression Outcomes Goal: Anginal pain relieved Outcome: Progressing Goal: Aspirin unless contraindicated Outcome: Completed/Met Date Met:  04/03/14 Given

## 2014-04-03 NOTE — Progress Notes (Signed)
Discharge instructions given & reviewed with patient and wife Eduction discussed  Darrel HooverWilson,Damein Gaunce S

## 2014-04-03 NOTE — Discharge Summary (Signed)
Physician Discharge Summary  Ivette LoyalRonnie D Hagemeister QMV:[id number] DOB: [birth year] DOA: 04/02/2014  PCP: Deforest HoylesGENTRY,DANIEL, MD  Admit date: 04/02/2014 Discharge date: 04/03/2014  Time spent: Less than 30 minutes  Recommendations for Outpatient Follow-up:  1. Dr. Deforest Hoylesaniel Gentry, PCP in 5 days - to be seen with repeat labs (BMP).  Discharge Diagnoses:  Principal Problem:   Chest pain Active Problems:   Unstable angina   Discharge Condition: Improved & Stable  Diet recommendation: Heart Healthy diet.  Filed Weights   04/02/14 1229 04/03/14 0357  Weight: 90.719 kg (200 lb) 88.225 kg (194 lb 8 oz)    History of present illness & Hospital course:  53 year old male patient with history of hyperlipidemia, former smoker, family history of MI - Dad died from MI in 260s, presented to the ED with complaints of chest pain that began on the day of admission. He described the chest pain across lower part of his chest, heaviness in nature with radiation to right arm, intermittent, associated with some sweats but no dyspnea and no pleuritic nature to it. Patient took some Rolaids without significant relief. No recent long-distance travel was reported. Lab work was unremarkable. Troponins 3 negative. D-dimer was negative. Chest x-ray: Negative for acute findings. Cardiology was consulted. EKG was unremarkable. 2-D echo was abnormal (report as below) following which he underwent left heart cath that showed angiographically normal coronary arteries and normal EF by LV gram without regional wall motion abnormalities. Discussed with Dr. Herbie BaltimoreHarding, Cardiology who cleared patient for discharge home post procedure precautions. Patient's chest pain had resolved. He was started on Statins for dyslipidemia and PPI for possible GERD which could have been cause of his presentation. Patient's creatinine is mildly elevated compared to admission and was asked to follow-up with PCP in a few days with repeat  BMP.   Consultations:  Cardiology  Procedures:  2 D Echo 04/03/14: Study Conclusions  - Left ventricle: The cavity size was moderately dilated. Wall thickness was normal. Systolic function was mildly reduced. The estimated ejection fraction was in the range of 45% to 50%. Akinesis of the basalinferior myocardium.  Impressions:  - There is akinesis / severe hypokinesis of the inferior base. He has ongoing chest pressure. He will likely need a cath.   Left heart catheterization with negative coronary angiography via right radial artery and left ventriculography:  POST-OPERATIVE DIAGNOSIS:   Angiographically Normal coronary arteries  Normal EF by LV gram with no regional wall motion analysis noted.  Low to normal LVEDP  PLAN OF CARE:  Standard post radial cath care.   Would not pursue additional ischemic evaluation for coronary cause of chest pain.   Discharge Exam:  Complaints:  no further chest pains post procedure.  Filed Vitals:   04/03/14 1656 04/03/14 1710 04/03/14 1754 04/03/14 1802  BP: 112/74 113/71 154/94 120/82  Pulse: 72 70 79 81  Temp:   97.8 F (36.6 C)   TempSrc:   Oral   Resp: 18 18 18 18   Height:      Weight:      SpO2: 98% 97% 98% 97%    General exam: pleasant young male lying comfortably in bed. Respiratory system: Clear. No increased work of breathing. No reproducible chest wall tenderness. Cardiovascular system: S1 & S2 heard, RRR. No JVD, murmurs, gallops, clicks or pedal edema. Telemetry: Sinus rhythm. Gastrointestinal system: Abdomen is nondistended, soft and nontender. Normal bowel sounds heard. Central nervous system: Alert and oriented. No focal neurological deficits. Extremities: Symmetric 5 x 5 power.  Discharge Instructions      Discharge Instructions    Call MD for:  severe uncontrolled pain    Complete by:  As directed      Diet - low sodium heart healthy    Complete by:  As directed      Increase activity  slowly    Complete by:  As directed             Medication List    TAKE these medications        pantoprazole 40 MG tablet  Commonly known as:  PROTONIX  Take 1 tablet (40 mg total) by mouth daily.     simvastatin 20 MG tablet  Commonly known as:  ZOCOR  Take 1 tablet (20 mg total) by mouth daily at 6 PM.       Follow-up Information    Follow up with GENTRY,DANIEL, MD. Schedule an appointment as soon as possible for a visit in 5 days.   Specialty:  Family Medicine   Why:  to be seen with repeat labs (BMP).       The results of significant diagnostics from this hospitalization (including imaging, microbiology, ancillary and laboratory) are listed below for reference.    Significant Diagnostic Studies: Dg Chest 2 View  04/02/2014   CLINICAL DATA:  Acute onset mid and right-sided chest pain radiating into the right arm earlier today. Former smoker who quit approximately 3 years ago.  EXAM: CHEST  2 VIEW  COMPARISON:  None.  FINDINGS: Cardiac silhouette normal in size. Thoracic aorta mildly tortuous and atherosclerotic. Hilar and mediastinal contours otherwise unremarkable. Lungs clear. Bronchovascular markings normal. Pulmonary vascularity normal. No visible pleural effusions. No pneumothorax. Degenerative changes involving the thoracic spine.  IMPRESSION: No acute cardiopulmonary disease.   Electronically Signed   By: Hulan Saashomas  Lawrence M.D.   On: 04/02/2014 13:45    Microbiology: No results found for this or any previous visit (from the past 240 hour(s)).   Labs: Basic Metabolic Panel:  Recent Labs Lab 04/02/14 1250 04/03/14 0118  NA 135* 138  K 4.9 4.3  CL 98 100  CO2 21 26  GLUCOSE 95 95  BUN 15 17  CREATININE 0.95 1.25  CALCIUM 9.8 9.4   Liver Function Tests:  Recent Labs Lab 04/02/14 1250 04/03/14 0118  AST 32 19  ALT 19 14  ALKPHOS 57 56  BILITOT 1.0 1.1  PROT 7.9 7.3  ALBUMIN 4.1 3.6   No results for input(s): LIPASE, AMYLASE in the last 168  hours. No results for input(s): AMMONIA in the last 168 hours. CBC:  Recent Labs Lab 04/02/14 1341 04/03/14 0118  WBC 6.6 7.4  NEUTROABS 3.6  --   HGB 14.9 14.1  HCT 41.9 40.7  MCV 88.0 88.9  PLT 193 208   Cardiac Enzymes:  Recent Labs Lab 04/02/14 1926 04/03/14 0118 04/03/14 0724  TROPONINI <0.30 <0.30 <0.30   BNP: BNP (last 3 results) No results for input(s): PROBNP in the last 8760 hours. CBG: No results for input(s): GLUCAP in the last 168 hours.   Additional labs: 1. Lipids: Cholesterol 254, triglycerides 122, HDL 35, LDL 195 and VLDL 24   Signed:  Nirel Babler, MD, FACP, FHM. [facility] Pager [phone]  If 7PM-7AM, please contact night-coverage [email or web address] Password [email or web address] 04/03/2014, 6:07 PM

## 2014-04-03 NOTE — ED Notes (Signed)
Transporting patient to new room assignment. 

## 2014-04-03 NOTE — Progress Notes (Signed)
  Echocardiogram 2D Echocardiogram has been performed.  Aaron Strickland [name] 04/03/2014, 11:47 AM

## 2014-04-03 NOTE — Progress Notes (Addendum)
MD paged for diet order. Aaron Strickland,Swain Acree S 4:35 PM   Okay to start heart health diet and possible discharge after rest time is over. Louie BunWilson,Karlissa Aron S 5:00 PM

## 2014-04-03 NOTE — CV Procedure (Signed)
CARDIAC CATHETERIZATION REPORT  NAME:  Aaron Strickland   MRN: [id number] DOB:  Jun 04, 1960   ADMIT DATE: 04/02/2014 Procedure Date: 04/03/2014  INTERVENTIONAL CARDIOLOGIST: Leonie Man, M.D., MS PRIMARY CARE PROVIDER: No primary care provider on file. PRIMARY CARDIOLOGIST: Mertie Moores, MD  PATIENT:  Aaron Strickland is a 53 y.o. male with a history of hyperlipidemia who presented to [facility] emergency room with a roughly 2 days of constant chest pressure across center of the chest radiating to the right arm. He describes it as a heaviness, while is originally was a sharp throbbing pain. The symptoms began at work while seated. He says the pain is never completely alleviated, but was at roughly 8/10 prior to arrival and is currently roughly 2-3/10. He was evaluated with an echocardiogram which suggested moderate hypokinesis with inferior wall motion and a mallet concerning for possible RCA related CAD causing his symptoms and therefore he is referred for invasive evaluation with cardiac catheterization.  PRE-OPERATIVE DIAGNOSIS:    CHEST PAIN WITH MODERATE RISK FOR CARDIAC ETIOLOGY  UNSTABLE ANGINA  ABNORMAL ECHOCARDIOGRAM  PROCEDURES PERFORMED:    Left Heart Catheterization with Native Coronary Angiography  via RIGHT RADIAL Artery   Left Ventriculography  Consent: Risks of procedure as well as the alternatives and risks of each were explained to the (patient/caregiver). Consent for procedure obtained.   Time Out: Verified patient identification, verified procedure, site/side was marked, verified correct patient position, special equipment/implants available, medications/allergies/relevent history reviewed, required imaging and test results available. Performed.  PROCEDURE: The patient was brought to the [facility] in the fasting state and prepped and draped in the usual sterile fashion for RIGHT RADIAL artery access. A modified Allen's test  was performed on the RIGHT wrist demonstrating excellent collateral flow for radial access.   Sterile technique was used including antiseptics, cap, gloves, gown, hand hygiene, mask and sheet. Skin prep: Chlorhexidine.   Access:   RIGHT RADIAL Artery: 6 Fr Sheath -  Seldinger Technique (Angiocath Micropuncture Kit)  Radial Cocktail - 10 mL; IV Heparin 4500 Units   Left Heart Catheterization: 5Fr Catheters advanced or exchanged over a long exchange safety J-wire; TIG 4.0 catheter advanced first.  Left & Right Coronary Artery Cineangiography: TIG 4.0 Catheter   LV Hemodynamics (LV Gram): Angled Pigtail Catheter   Sheath removed in the CARDIAC CATH LAB with VASC BAND application for hemostasis.  VASC Band: 1545  Hours; 8 mL air  FINDINGS:  Hemodynamics:   Central Aortic Pressure / Mean: 96/57/75 mmHg  Left Ventricular Pressure / LVEDP: 99/0/3 mmHg  Left Ventriculography:  EF: roughly 55% %  Wall Motion: no regional wall motion modalities seen. There may be mild basal inferior hypokinesis, but most likely this is just normal wall motion.  Coronary Anatomy:  Dominance: right  Left Main: very short, large-caliber vessel that trifurcates into the LAD, Ramus Intermedius and Circumflex. Angiographically normal. LAD: large-caliber vessel that gives rise to several small proximal and mid diagonal branches with one major mid diagonal branch. The vessel itself tapers down to the apex.  The entire LAD system is angiographically free of disease.  Left Circumflex: moderate caliber, nondominant vessel that bifurcates in the mid AV groove into a moderate caliber OM branch and the AV groove vessel that terminates as a posterolateral branch. Angiographically normal. The distal vessels are relatively small.  Ramus intermedius: large-caliber vessel that courses down almost to the apex and trifurcates distally and 3 small branches. Angiographically normal.  RCA: large-caliber, dominant vessel  with a large marginal branch comes off very proximally. The vessel is relatively free of disease with may be 10-20% mid eccentric stenosis. The vessel bifurcates distally into the Right Posterior Descending Artery (RPDA) and the Right Posterior AV Groove Branch (RPAV).  RPDA: moderate caliber vessel with a proximal and distal branches that coarse as tandem vessel PDAs. Angiographically normal..  RPL Sysytem:The RPAV moderate caliber vessel that gives into a small RPL 1 and larger RPL 2. Just prior to the bifurcation there is roughly 30% stenosis. Otherwise angiographically normal.  MEDICATIONS:  Anesthesia:  Local Lidocaine 2 ml  Sedation:  2 mg IV Versed, 25 mcg IV fentanyl ;  Omnipaque Contrast: 70 ml  Anticoagulation:  IV Heparin 4500 Units  Radial Cocktail: 5 mg Verapamil, 400 mcg NTG, 2 ml 2% Lidocaine in 10 ml NS  PATIENT DISPOSITION:    The patient was transferred to the PACU holding area in a hemodynamicaly stable, chest pain free condition.  The patient tolerated the procedure well, and there were no complications.  EBL:   < 10 ml  The patient was stable before, during, and after the procedure.  POST-OPERATIVE DIAGNOSIS:    Angiographically Normal coronary arteries  Normal EF by LV gram with no regional wall motion analysis noted.  Low to normal LVEDP  PLAN OF CARE:  Standard post radial cath care.   Would not pursue additional ischemic evaluation for coronary cause of chest pain.   Leonie Man, M.D., M.S. Interventional Cardiologist   Pager # [phone]

## 2014-04-03 NOTE — Interval H&P Note (Signed)
History and Physical Interval Note:  04/03/2014 3:08 PM  Aaron Strickland  has presented today for surgery, with the diagnosis of Unstable Angina -- presented with resting CP & Echo with regional wall motion abnormality & reduced EF.    The various methods of treatment have been discussed with the patient and family.   Risks / Complications include, but not limited to: Death, MI, CVA/TIA, VF/VT (with defibrillation), Bradycardia (need for temporary pacer placement), contrast induced nephropathy, bleeding / bruising / hematoma / pseudoaneurysm, vascular or coronary injury (with possible emergent CT or Vascular Surgery), adverse medication reactions, infection.  Additional risks involving the use of radiation with the possibility of radiation burns and cancer were explained in detail.  The patient voice understanding and agree to proceed.     After consideration of risks, benefits and other options for treatment, the patient has consented to  Procedure(s): LEFT HEART CATHETERIZATION WITH CORONARY ANGIOGRAM (N/A) +/- PCI as a surgical intervention .  The patient's history has been reviewed, patient examined, no change in status, stable for surgery.  I have reviewed the patient's chart and labs.  Questions were answered to the patient's satisfaction.    Cath Lab Visit (complete for each Cath Lab visit)  Clinical Evaluation Leading to the Procedure:   ACS: Yes.    Non-ACS:    Anginal Classification: CCS IV  Anti-ischemic medical therapy: Minimal Therapy (1 class of medications)  Non-Invasive Test Results: Intermediate-risk stress test findings: cardiac mortality 1-3%/year; ECHO CARDIOGRAM WITH EF OF 45% WITH BASAL INFERIOR WALL MOTION AND ABNORMALITY IN THE SETTING OF ACTIVE CHEST PAIN. tHIS IS CONCERNING FOR ISCHEMIC lv DYSFUNCTION.  Prior CABG: No previous CABG  Aaron Strickland W

## 2014-04-03 NOTE — ED Notes (Signed)
Spoke with Dr. Clearence PedSchorr, states ok for pt to go tele floor.

## 2014-04-03 NOTE — Progress Notes (Signed)
Pt's IV and tele box removed. Pt dressed himself and taken down by wheelchair to be discharged home. Pt had no complaints of pain. Vital signs stable.

## 2014-05-07 ENCOUNTER — Encounter (HOSPITAL_COMMUNITY): Payer: Self-pay | Admitting: Cardiology

## 2015-06-25 IMAGING — CR DG CHEST 2V
2 series · 2 of 2 positions shown · non-contrast
Comparison: None.

CLINICAL DATA: Acute onset mid and right-sided chest pain radiating
into the right arm earlier today. Former smoker who quit
approximately 3 years ago.

EXAM:
CHEST  2 VIEW

[w chest pa]
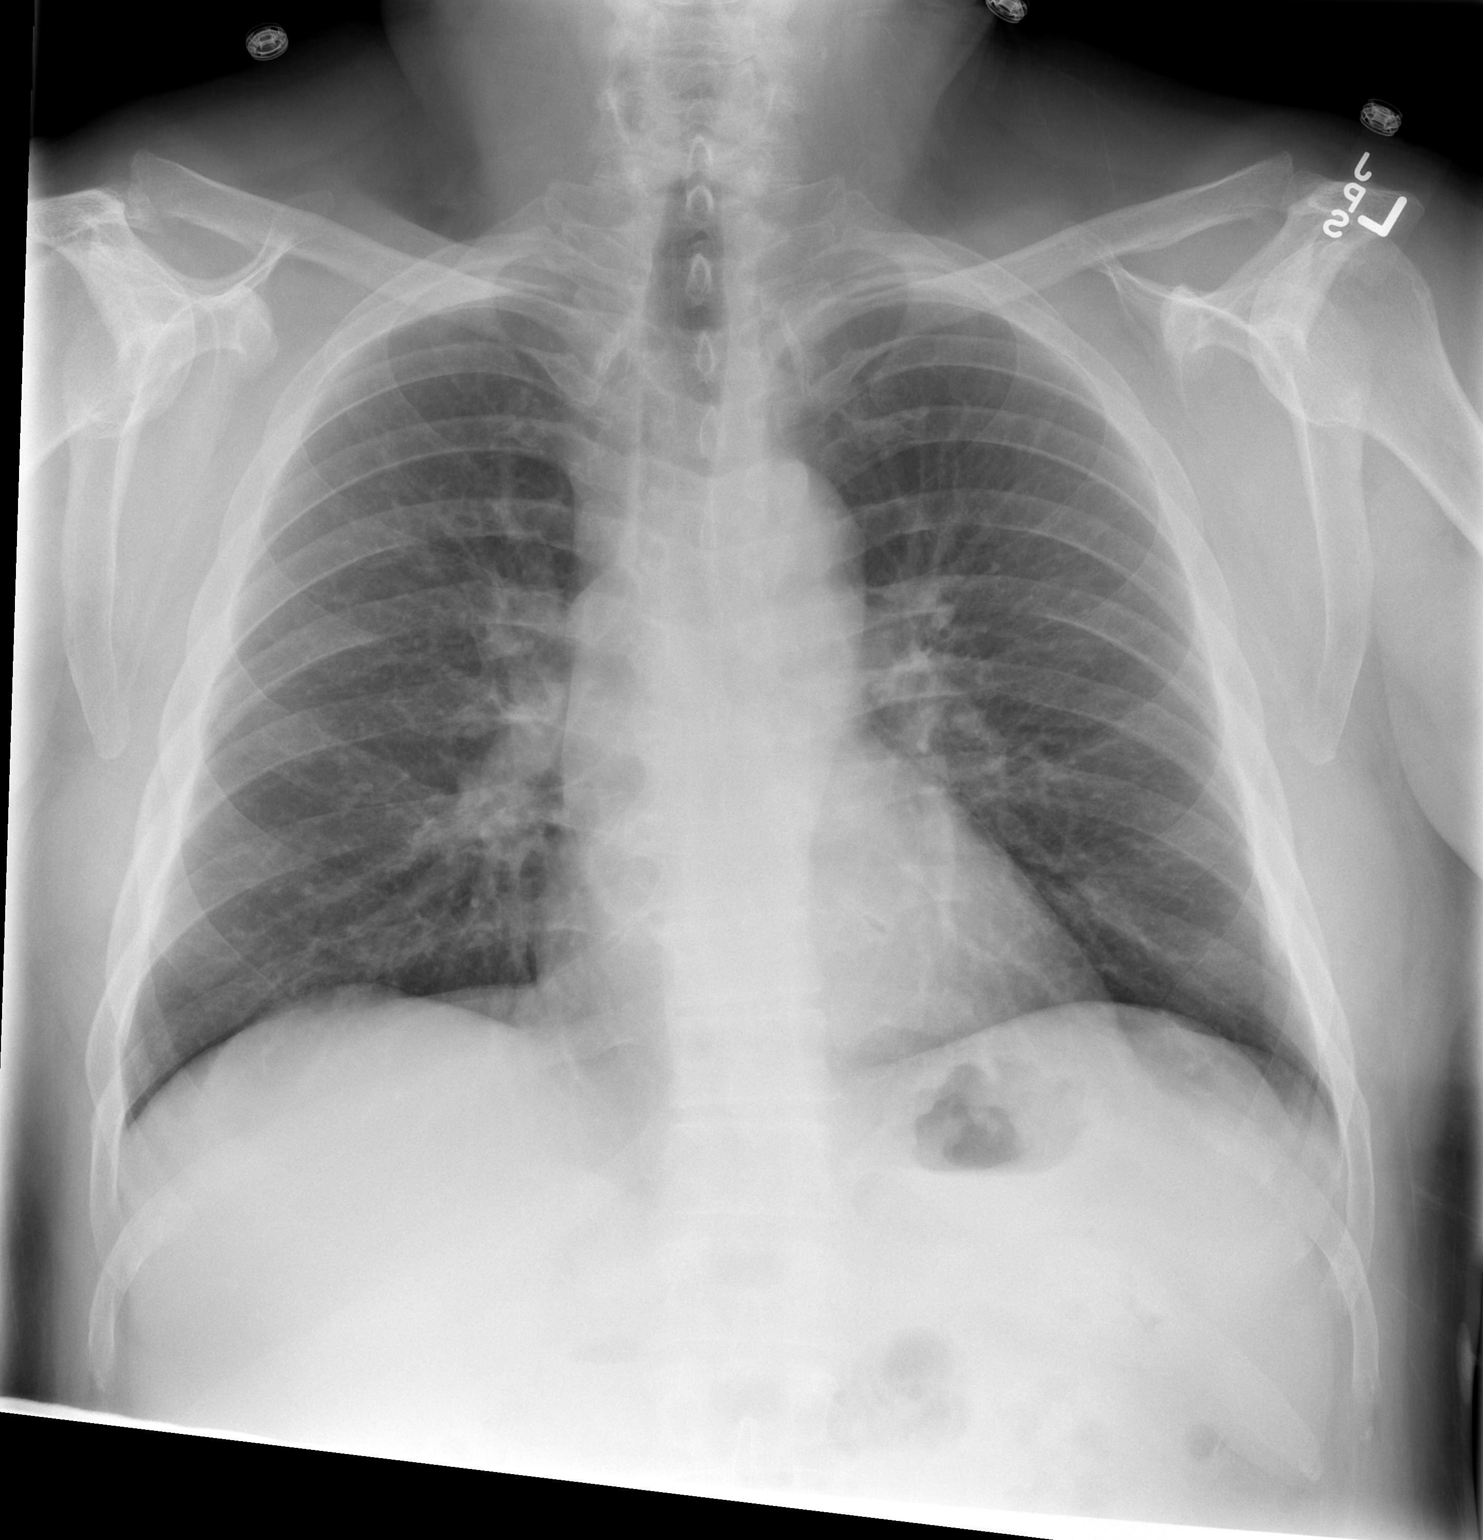

[w chest lat]
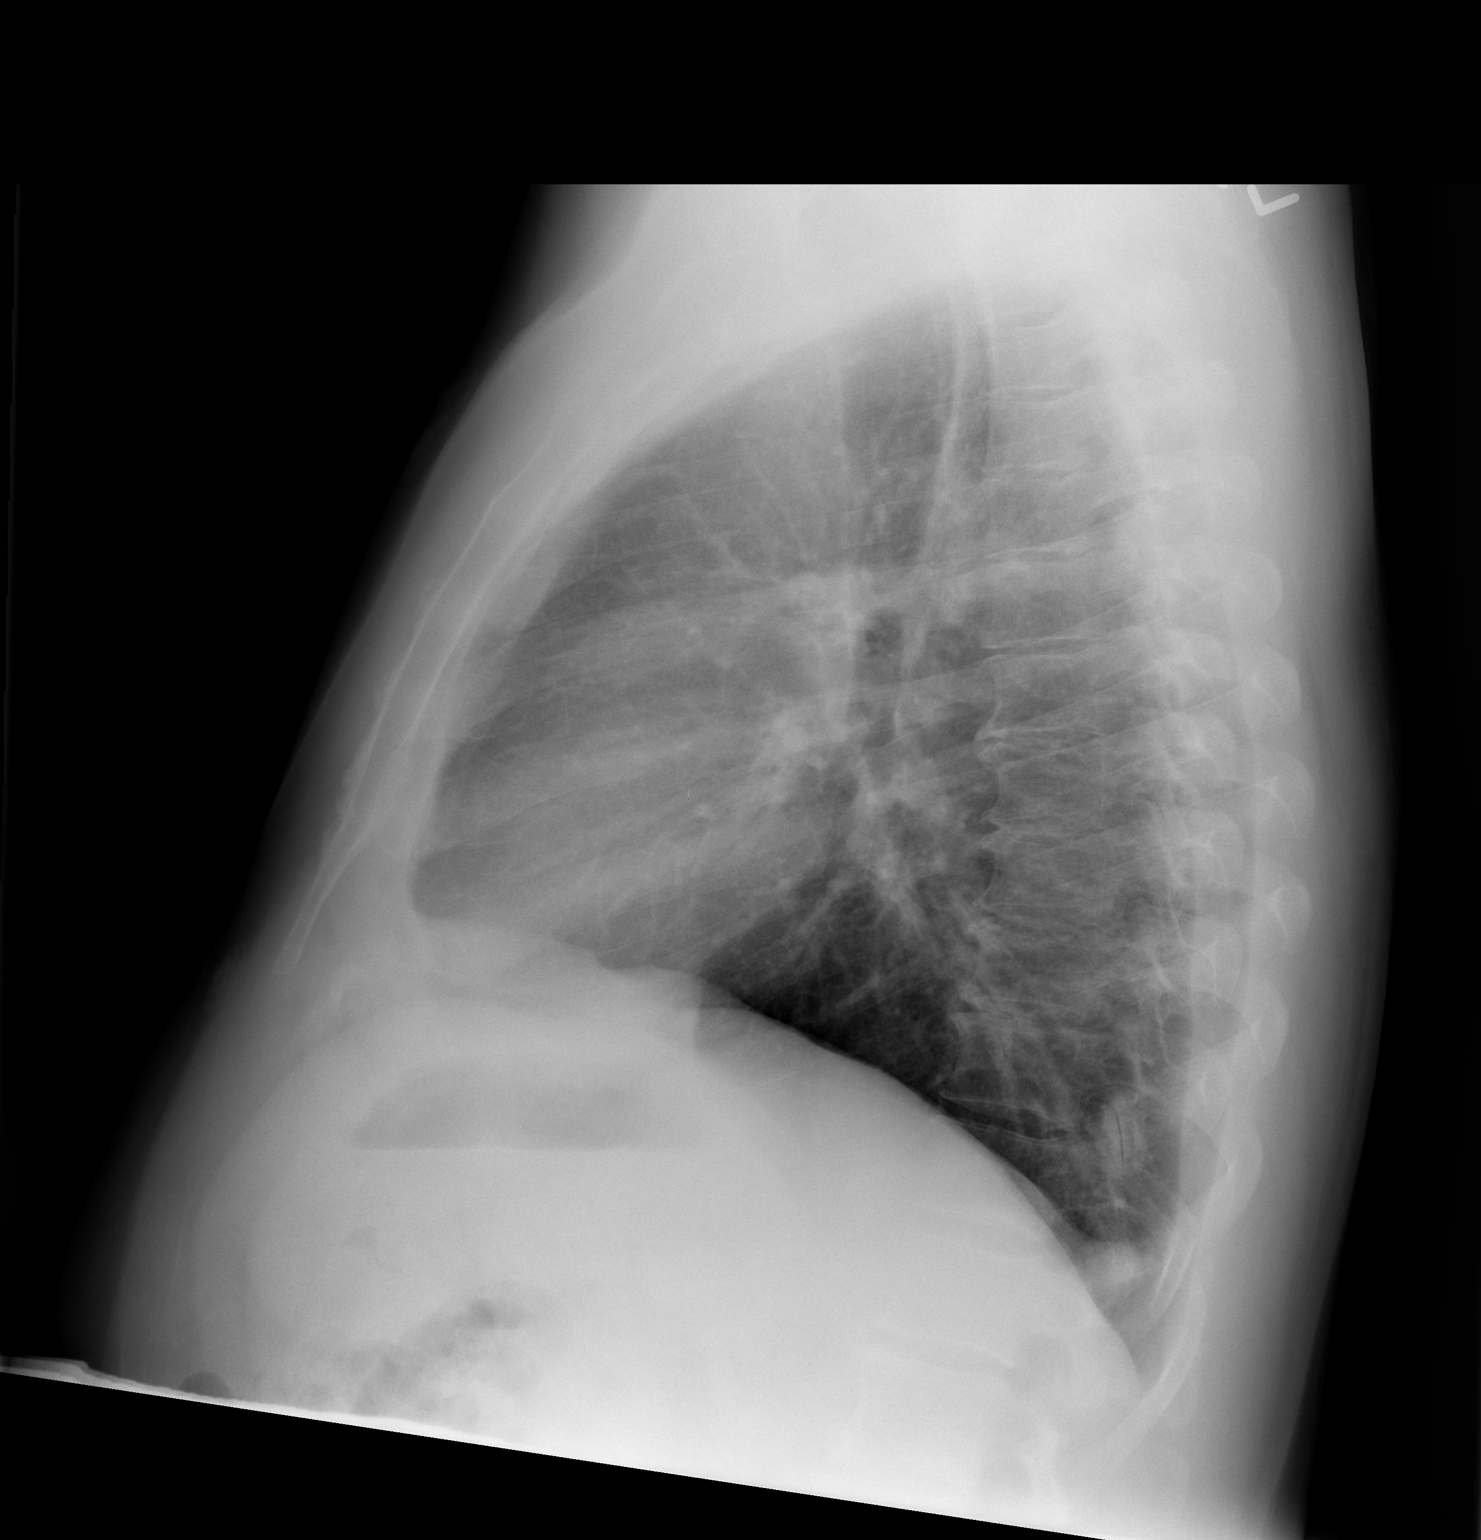

[2 of 2 positions shown; findings below may reference images not displayed]

FINDINGS: Cardiac silhouette normal in size. Thoracic aorta mildly tortuous
and atherosclerotic. Hilar and mediastinal contours otherwise
unremarkable. Lungs clear. Bronchovascular markings normal.
Pulmonary vascularity normal. No visible pleural effusions. No
pneumothorax. Degenerative changes involving the thoracic spine.
IMPRESSION: No acute cardiopulmonary disease.
